# Patient Record
Sex: Male | Born: 2005 | Race: White | Hispanic: Yes | Marital: Single | Smoking: Never smoker
Health system: Southern US, Community
[De-identification: ages and names within clinical notes are randomized; demographics above are authoritative.]

## PROBLEM LIST (undated history)

## (undated) DIAGNOSIS — Z9109 Other allergy status, other than to drugs and biological substances: Secondary | ICD-10-CM

---
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
## 2023-01-18 DIAGNOSIS — Z23 Encounter for immunization: Secondary | ICD-10-CM | POA: Diagnosis not present

## 2023-06-26 DIAGNOSIS — R5383 Other fatigue: Secondary | ICD-10-CM | POA: Diagnosis not present

## 2023-06-26 DIAGNOSIS — R110 Nausea: Secondary | ICD-10-CM | POA: Diagnosis not present

## 2023-06-26 DIAGNOSIS — R52 Pain, unspecified: Secondary | ICD-10-CM | POA: Diagnosis not present

## 2023-08-22 DIAGNOSIS — M25552 Pain in left hip: Secondary | ICD-10-CM | POA: Diagnosis not present

## 2023-08-22 DIAGNOSIS — T148XXA Other injury of unspecified body region, initial encounter: Secondary | ICD-10-CM | POA: Diagnosis not present

## 2023-08-22 DIAGNOSIS — J302 Other seasonal allergic rhinitis: Secondary | ICD-10-CM | POA: Diagnosis not present

## 2023-09-22 DIAGNOSIS — Z23 Encounter for immunization: Secondary | ICD-10-CM | POA: Diagnosis not present

## 2023-09-22 DIAGNOSIS — T798XXA Other early complications of trauma, initial encounter: Secondary | ICD-10-CM | POA: Diagnosis not present

## 2023-09-22 DIAGNOSIS — R519 Headache, unspecified: Secondary | ICD-10-CM | POA: Diagnosis not present

## 2023-09-22 DIAGNOSIS — R509 Fever, unspecified: Secondary | ICD-10-CM | POA: Diagnosis not present

## 2023-09-22 DIAGNOSIS — S60562D Insect bite (nonvenomous) of left hand, subsequent encounter: Secondary | ICD-10-CM | POA: Diagnosis not present

## 2023-10-13 DIAGNOSIS — L255 Unspecified contact dermatitis due to plants, except food: Secondary | ICD-10-CM | POA: Diagnosis not present
